# Patient Record
Sex: Female | Born: 1965 | Race: Black or African American | Hispanic: No | Marital: Single | Smoking: Never smoker
Health system: Southern US, Community
[De-identification: ages and names within clinical notes are randomized; demographics above are authoritative.]

## PROBLEM LIST (undated history)

## (undated) DIAGNOSIS — M1990 Unspecified osteoarthritis, unspecified site: Secondary | ICD-10-CM

## (undated) HISTORY — DX: Unspecified osteoarthritis, unspecified site: M19.90

## (undated) HISTORY — PX: KNEE SURGERY: SHX244

---

## 2009-12-14 ENCOUNTER — Ambulatory Visit
Admit: 2009-12-14 | Disposition: A | Discharge status: Home / Self Care | Payer: Self-pay | Source: Ambulatory Visit | Admitting: Dermatology

## 2009-12-14 LAB — COMPREHENSIVE METABOLIC PANEL
ALT: 16 U/L (ref 0–55)
AST (SGOT): 24 U/L (ref 5–34)
Albumin/Globulin Ratio: 1 (ref 0.9–2.2)
Albumin: 3.3 g/dL — ABNORMAL LOW (ref 3.5–5.0)
Alkaline Phosphatase: 39 U/L — ABNORMAL LOW (ref 40–150)
BUN: 7 mg/dL (ref 6.0–20.0)
Bilirubin, Total: 0.2 mg/dL (ref 0.1–1.2)
CO2: 26 mEq/L (ref 21–30)
Calcium: 9.4 mg/dL (ref 8.5–10.5)
Chloride: 106 mEq/L (ref 96–109)
Creatinine: 0.8 mg/dL (ref 0.4–1.5)
Globulin: 3.4 g/dL (ref 2.0–3.7)
Glucose: 85 mg/dL (ref 70–100)
Potassium: 3.9 mEq/L (ref 3.5–5.3)
Protein, Total: 6.7 g/dL (ref 6.0–8.3)
Sodium: 140 mEq/L (ref 135–146)

## 2009-12-14 LAB — CBC AND DIFFERENTIAL
Baso(Absolute): 0.02 10*3/uL (ref 0.00–0.20)
Basophils: 0 % (ref 0–2)
Eosinophils Absolute: 0.07 10*3/uL (ref 0.00–0.70)
Eosinophils: 1 % (ref 0–5)
Hematocrit: 37.7 % (ref 37.0–47.0)
Hgb: 12.6 g/dL (ref 12.0–16.0)
Immature Granulocytes Absolute: 0.01 10*3/uL
Immature Granulocytes: 0 % (ref 0–1)
Lymphocytes Absolute: 1.81 10*3/uL (ref 0.50–4.40)
Lymphocytes: 30 % (ref 15–41)
MCH: 29 pg (ref 28.0–32.0)
MCHC: 33.4 g/dL (ref 32.0–36.0)
MCV: 86.7 fL (ref 80.0–100.0)
MPV: 11.3 fL (ref 9.4–12.3)
Monocytes Absolute: 0.4 10*3/uL (ref 0.00–1.20)
Monocytes: 7 % (ref 0–11)
Neutrophils Absolute: 3.66 10*3/uL
Neutrophils: 61 % (ref 52–75)
Platelets: 276 10*3/uL (ref 140–400)
RBC: 4.35 10*6/uL (ref 4.20–5.40)
RDW: 14 % (ref 12–15)
WBC: 5.97 10*3/uL (ref 3.50–10.80)

## 2009-12-14 LAB — IRON PROFILE
Iron Saturation: 13 % — ABNORMAL LOW (ref 15–50)
Iron: 56 ug/dL (ref 40–145)
TIBC: 443 ug/dL (ref 265–497)
UIBC: 387 ug/dL — ABNORMAL HIGH (ref 126–382)

## 2009-12-14 LAB — TSH: TSH: 0.95 u[IU]/mL (ref 0.35–4.94)

## 2009-12-14 LAB — HEMOLYSIS INDEX: Hemolysis Index: 2 Index (ref 0–18)

## 2009-12-14 LAB — T4, FREE: T4 Free: 1.18 ng/dL (ref 0.70–1.48)

## 2009-12-14 LAB — T3, FREE: T3, Free: 2.65 pg/mL (ref 1.71–3.71)

## 2009-12-14 LAB — GFR: EGFR: >60

## 2009-12-14 LAB — FERRITIN: Ferritin: 46.77 ng/mL (ref 4.60–204.00)

## 2009-12-15 LAB — DEHYDROEPIANDROSTERONE SULFATE, SERUM-SOFT: Dehydroepiandrosterone Sulfate, Serum: 45.1 ug/dL (ref 18–244)

## 2009-12-18 LAB — SJOGRENS SYNDROME-B EXTRACTABLE NUCLEAR ANTIBODY
Sjogrens SSB (La) Antibody Interpretation: NEGATIVE
Sjogrens SSB (La) Antibody: 4 U/mL (ref 0–99)

## 2009-12-18 LAB — TESTOSTERONE, FREE, TOTAL - SOFT
TESTOSTERONE, FREE, S: <0.2 ng/dL — ABNORMAL LOW (ref 0.3–1.9)
Testosterone, Total: 22 ng/dL (ref 8–60)

## 2009-12-18 LAB — ANDROSTENEDIONE-SOFT: Androstenedione: 43 ng/dL (ref 30–200)

## 2009-12-18 LAB — SJOGRENS SYNDROME-A EXTRACTABLE NUCLEAR ANTIBODY(SOFT)
SSA Interpretation: NEGATIVE
Sjogrens SSA (Ro) Antibody: 3 U/mL (ref 0–99)

## 2009-12-19 LAB — MISCELLANEOUS QUEST TEST

## 2014-07-27 ENCOUNTER — Encounter
Admission: RE | Disposition: A | Discharge status: Home / Self Care | Payer: Self-pay | Source: Ambulatory Visit | Attending: Gastroenterology

## 2014-07-27 ENCOUNTER — Ambulatory Visit
Admission: RE | Admit: 2014-07-27 | Discharge: 2014-07-27 | Disposition: A | Discharge status: Home / Self Care | Source: Ambulatory Visit | Attending: Gastroenterology | Admitting: Gastroenterology

## 2014-07-27 ENCOUNTER — Ambulatory Visit: Admitting: Certified Registered"

## 2014-07-27 ENCOUNTER — Ambulatory Visit: Admitting: Gastroenterology

## 2014-07-27 ENCOUNTER — Ambulatory Visit: Payer: Self-pay

## 2014-07-27 DIAGNOSIS — D128 Benign neoplasm of rectum: Secondary | ICD-10-CM

## 2014-07-27 DIAGNOSIS — K648 Other hemorrhoids: Secondary | ICD-10-CM | POA: Insufficient documentation

## 2014-07-27 DIAGNOSIS — Z1211 Encounter for screening for malignant neoplasm of colon: Secondary | ICD-10-CM | POA: Insufficient documentation

## 2014-07-27 DIAGNOSIS — K621 Rectal polyp: Secondary | ICD-10-CM | POA: Insufficient documentation

## 2014-07-27 HISTORY — PX: COLONOSCOPY, BIOPSY: SHX3468

## 2014-07-27 SURGERY — COLONOSCOPY, WITH BIOPSY
Anesthesia: Anesthesia General | Site: Abdomen | Wound class: Clean Contaminated

## 2014-07-27 MED ORDER — PROPOFOL INFUSION 10 MG/ML
INTRAVENOUS | Status: DC | PRN
Start: 2014-07-27 — End: 2014-07-27
  Administered 2014-07-27 (×5): 20 mg via INTRAVENOUS

## 2014-07-27 MED ORDER — PROPOFOL INFUSION 10 MG/ML
INTRAVENOUS | Status: DC | PRN
Start: 2014-07-27 — End: 2014-07-27
  Administered 2014-07-27: 100 ug/kg/min via INTRAVENOUS

## 2014-07-27 MED ORDER — LIDOCAINE HCL 2 % IJ SOLN
INTRAMUSCULAR | Status: DC | PRN
Start: 2014-07-27 — End: 2014-07-27
  Administered 2014-07-27: 40 mg via INTRAVENOUS

## 2014-07-27 MED ORDER — LACTATED RINGERS IV SOLN
INTRAVENOUS | Status: DC | PRN
Start: 2014-07-27 — End: 2014-07-27

## 2014-07-27 SURGICAL SUPPLY — 19 items
CANISTER 1000CC (Procedure Accessories) ×2 IMPLANT
FORCEPS BIOPSY L240 CM MICROMESH TEETH STREAMLINE CATHETER NEEDLE (Instrument) IMPLANT
FORCEPS BIOPSY L240 CM STANDARD CAPACITY (Instrument) ×1
FORCEPS RADIAL JAW 4 2.8MM (Instrument) ×1
GAUZE SPONGE 4X4 NS (Dressing) ×10
GOWN ISO YELLOW UNIVERSAL (Gown) ×4 IMPLANT
KIT UNIVERSAL IRRIGATION SOL (Kits) ×2 IMPLANT
SOFT-CUF 2T ADULT SUB-MIN (Cuff) ×2 IMPLANT
SOL WATER STERILE 1000CC BTLE (Irrigation Solutions) ×1
SOLN LUBRICATING JELLY 4.25OZ (Irrigation Solutions) ×2 IMPLANT
SPONGE GAUZE L4 IN X W4 IN 16 PLY (Dressing) ×10
SPONGE GAUZE L4 IN X W4 IN 16 PLY MAXIMUM ABSORBENT USP TYPE VII (Dressing) ×10 IMPLANT
SYRINGE 50 ML GRADUATE NONPYROGENIC DEHP (Syringes, Needles) ×1
SYRINGE 50 ML GRADUATE NONPYROGENIC DEHP FREE PVC FREE BD MEDICAL (Syringes, Needles) ×1 IMPLANT
SYRINGE SLIP-TIP 60CC (Syringes, Needles) ×1
WATER STERILE PLASTIC POUR BOTTLE 1000 (Irrigation Solutions) ×1
WATER STERILE PLASTIC POUR BOTTLE 1000 ML (Irrigation Solutions) ×1 IMPLANT
WATER STERILE PLASTIC POUR BOTTLE 250 ML (Irrigation Solutions) ×1 IMPLANT
WATER STRL IRRIG 250ML BTL (Irrigation Solutions) ×1

## 2014-07-27 NOTE — Discharge Instructions (Signed)
Endoscopy Discharge Instructions  General Instructions:  1. Following sedation, your judgement, perception, and coordination are considered impaired. Even though you may feel awake and alert, you are considered legally intoxicated. Therefore, until the next morning;   Do not Drive   Do not operate appliances or equipment that requires reaction time (e.g.    Stove, electrical tools, machinery)   Do not sign legal documents or be involved in important decisions.   Do not smoke if alone   Do not drink alcoholic beverages   Go directly home and rest for several hours before resuming your routine    activities.   It is highly recommended to have a responsible adult stay with you for the   next 24 hours    2. Tenderness, swelling or pain may occur at the IV site where you received sedation. If you experience this, apply warm soaks to the area. Notify your physician if this persists.    Instructions Specific To Procedures - Report To Physician Any Of The Following:    Upper Endoscopy, ERCP, Dilations   1. Pain in Chest   2. Nausea/vomitting   3. Fevers/Chills within 24 hours after procedure. Temp>101deg F   4. Severe and persistent abdominal pain and bloating     In Addition:   Mild throat soreness may follow this procedure. Warm salt water gargling or   lozenges of your choice will most likely relieve your discomfort or cold drinks and   popsicles.     Colon/Sigmoidoscopy/Proctoscopy   1. Severe and persistent abdominal pain/bloating which does not subside within 2-3 hours   2. Large amount of rectal bleeding (some mucosal blood streaking may occur, especially if biopsy or polypectomy was done or if hemorrhoids are present.   3. Nausea/vomitting   4. Fevers/Chills within 24 hours after procedure. Temp>101deg F     In Addition:\     If polyp has been removed, DO NOT take aspirin or aspirin containing products   (e.g. Anacin, Alka Seltzer, Bufferin, Etc.) or non-steroidal anti-inflammatory drugs  (e.g. Advil, Motrin,  etc.) for 14 days unless otherwise advised otherwise on your Patient Procedure Report and Recommendations. Tylenol or extra Strength Tylenol is permitted.        Additional Discharge Instructions  Your diet after the procedure: No restriction.  Special Instructions: See Procedure Patient Report Recommendations    If biopsies were taken, call Dr. Everardo Voris at [phone] in one week for results    Patient education literature given; Yes      If you have questions or problems contact your MD immediately. If you need immediate attention, call your MD, 911 and/or go to nearest emergency room.

## 2014-07-27 NOTE — Transfer of Care (Signed)
Anesthesia Transfer of Care Note    Patient: Jodi Klein    Procedures performed: Procedure(s):  COLONOSCOPY, BIOPSY    Anesthesia type: General TIVA    Patient location:[facility]    Last vitals:   Filed Vitals:    07/27/14 1018   BP: 100/58   Pulse: 73   Temp: 36.2 C (97.2 F)   Resp: 14   SpO2: 97%       Post pain: Patient not complaining of pain, continue current therapy      Mental Status:awake and alert     Respiratory Function: tolerating room air    Cardiovascular: stable    Nausea/Vomiting: patient not complaining of nausea or vomiting    Hydration Status: adequate    Post assessment: no apparent anesthetic complications, no reportable events and no evidence of recall

## 2014-07-27 NOTE — Anesthesia Preprocedure Evaluation (Signed)
Anesthesia Evaluation    AIRWAY    Mallampati: II    TM distance: >3 FB  Neck ROM: full  Mouth Opening:full   CARDIOVASCULAR    cardiovascular exam normal       DENTAL    no notable dental hx     PULMONARY    pulmonary exam normal     OTHER FINDINGS                      Anesthesia Plan    ASA 2     general                     intravenous induction   Detailed anesthesia plan: general IV        Post op pain management: per surgeon    informed consent obtained    Plan discussed with CRNA.

## 2014-07-27 NOTE — H&P (Signed)
GI PRE PROCEDURE NOTE    Proceduralist Comments:   Review of Systems and Past Medical / Surgical History performed: Yes     Indications:Colon cancer screening        Physical Exam / Laboratory Data (If applicable)       General: Alert and cooperative  Lungs: Lungs clear to auscultation  Cardiac: RRR, normal S1S2.    Abdomen: Soft, non tender. Normal active bowel sounds  Other:         Planned Sedation:   Deep sedation with anesthesia    Attestation:   Jodi Klein has been reassessed immediately prior to the procedure and is an appropriate candidate for the planned sedation and procedure. Risks, benefits and alternatives to the planned procedure and sedation have been explained to the patient or guardian:  yes        Signed by: Virgina Norfolk

## 2014-07-27 NOTE — Anesthesia Postprocedure Evaluation (Signed)
Anesthesia Post Evaluation    Patient: Torri Knupp    Procedures performed: Procedure(s):  COLONOSCOPY, BIOPSY    Anesthesia type: General TIVA    Patient location:[facility]    Last vitals:   Filed Vitals:    07/27/14 1055   BP: 104/59   Pulse: 66   Temp: 36.2   Resp: 16   SpO2: 99%       Post pain: Patient not complaining of pain, continue current therapy      Mental Status:awake and alert     Respiratory Function: tolerating room air    Cardiovascular: stable    Nausea/Vomiting: patient not complaining of nausea or vomiting    Hydration Status: adequate    Post assessment: no apparent anesthetic complications

## 2014-07-28 LAB — LAB USE ONLY - HISTORICAL SURGICAL PATHOLOGY

## 2014-07-31 ENCOUNTER — Encounter: Payer: Self-pay | Admitting: Gastroenterology

## 2020-01-10 IMAGING — MG DIGITAL SCREENING BILATERAL MAMMOGRAM WITH CAD
4 series · 4 of 4 positions shown · non-contrast
Comparison: Previous exam(s).

CLINICAL DATA: Screening.

EXAM:
DIGITAL SCREENING BILATERAL MAMMOGRAM WITH CAD

[L MLO]
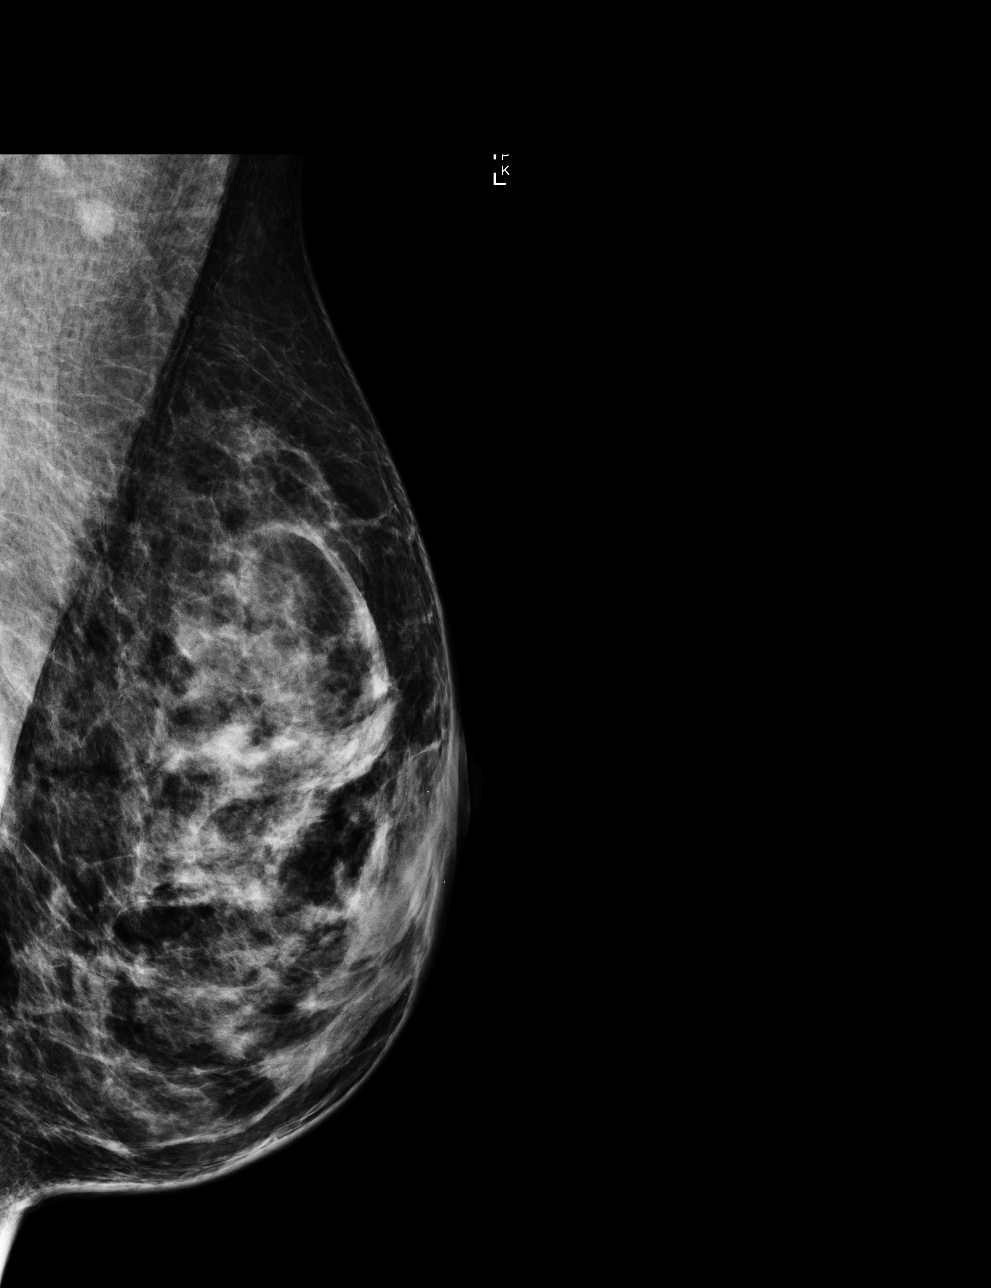

[R CC]
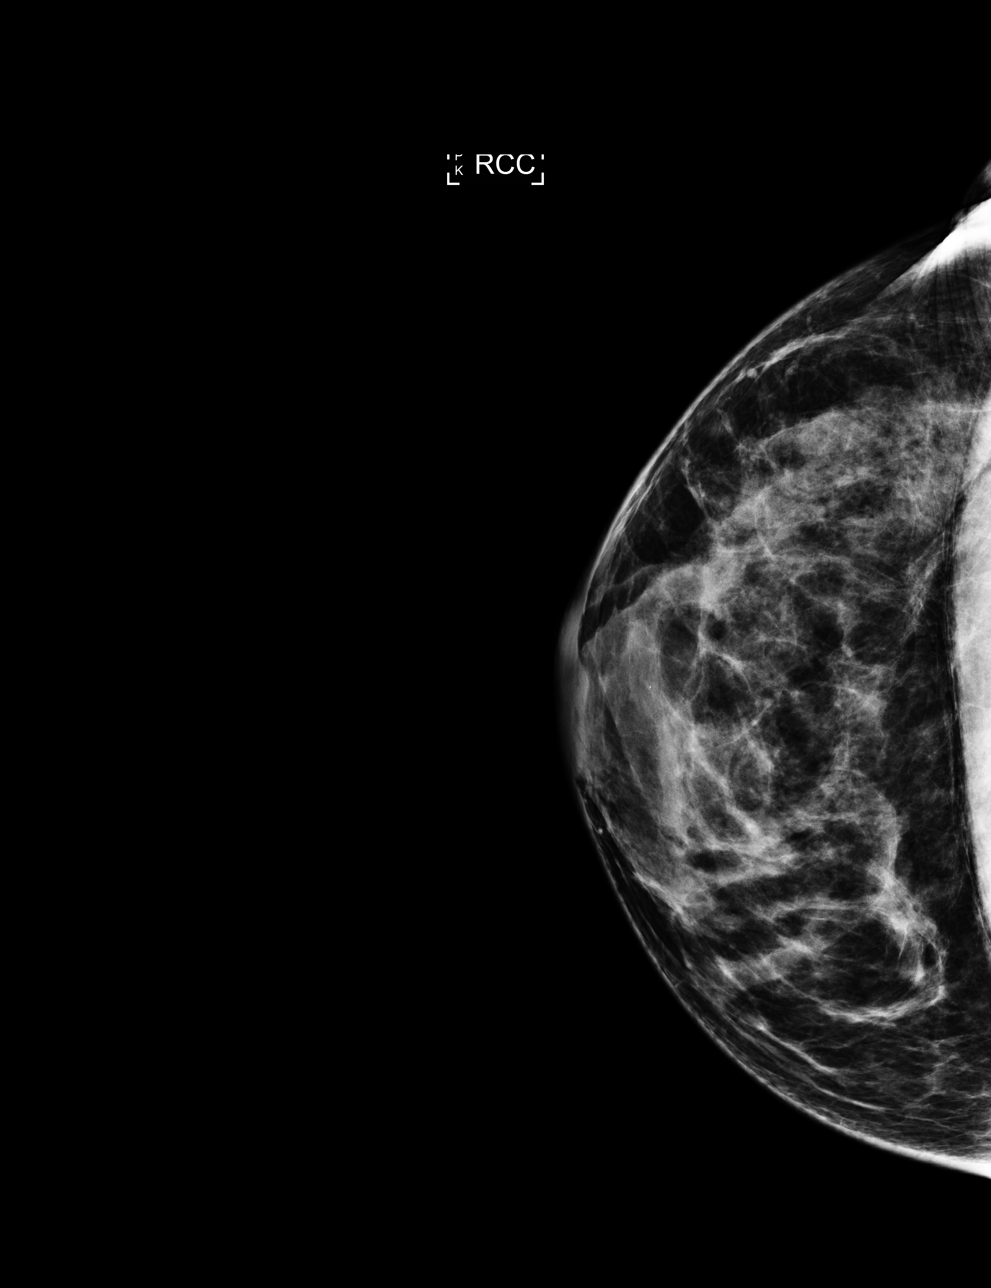

[L CC]
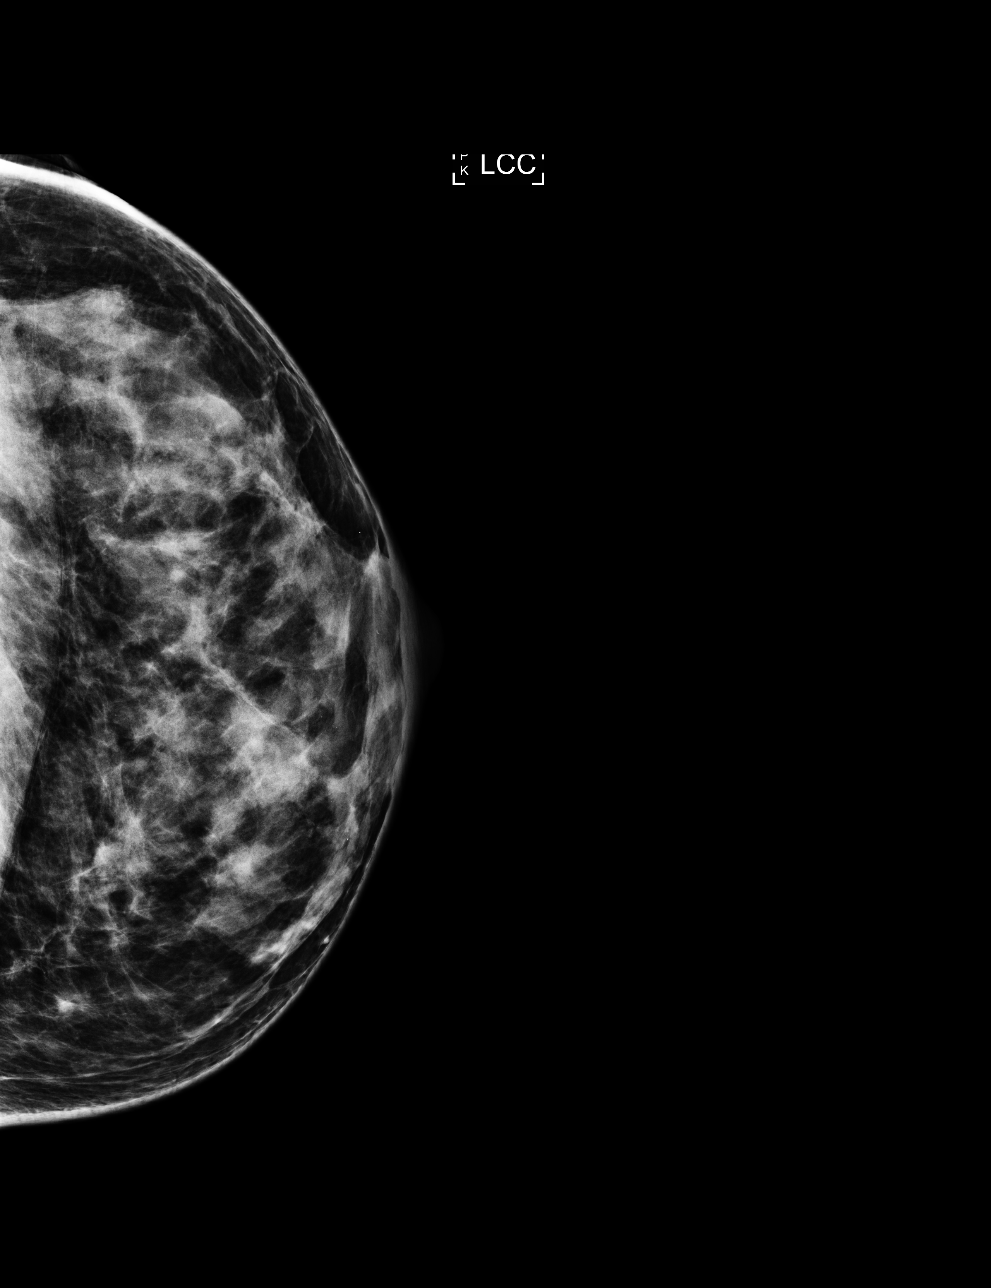

[R MLO]
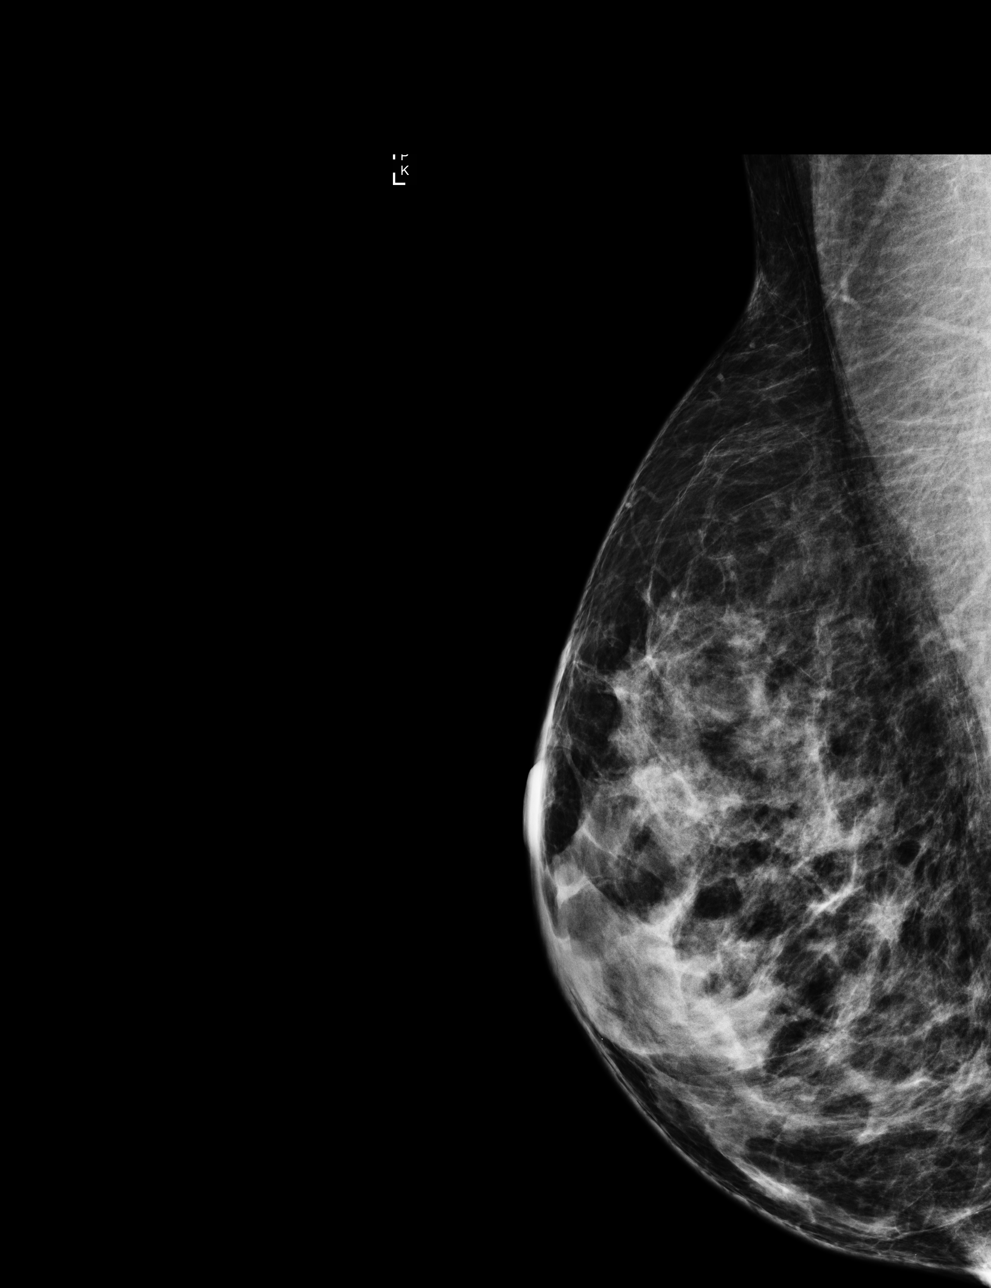

[4 of 4 positions shown; findings below may reference images not displayed]

ACR Breast Density Category c: The breast tissue is heterogeneously
dense, which may obscure small masses.
FINDINGS: There are no findings suspicious for malignancy. Images were
processed with CAD.
IMPRESSION: No mammographic evidence of malignancy. A result letter of this
screening mammogram will be mailed directly to the patient.

RECOMMENDATION:
Screening mammogram in one year. (Code:YJ-[id number])

BI-RADS CATEGORY  1: Negative.

## 2024-03-05 IMAGING — MG MM DIGITAL SCREENING BILAT W/ TOMO AND CAD
8 series · 9 of 24 positions shown · non-contrast
Comparison: Previous exam(s).

CLINICAL DATA: Screening.

EXAM:
DIGITAL SCREENING BILATERAL MAMMOGRAM WITH TOMOSYNTHESIS AND CAD
TECHNIQUE: Bilateral screening digital craniocaudal and mediolateral oblique
mammograms were obtained. Bilateral screening digital breast
tomosynthesis was performed. The images were evaluated with
computer-aided detection.

[R MLO synth-2D]
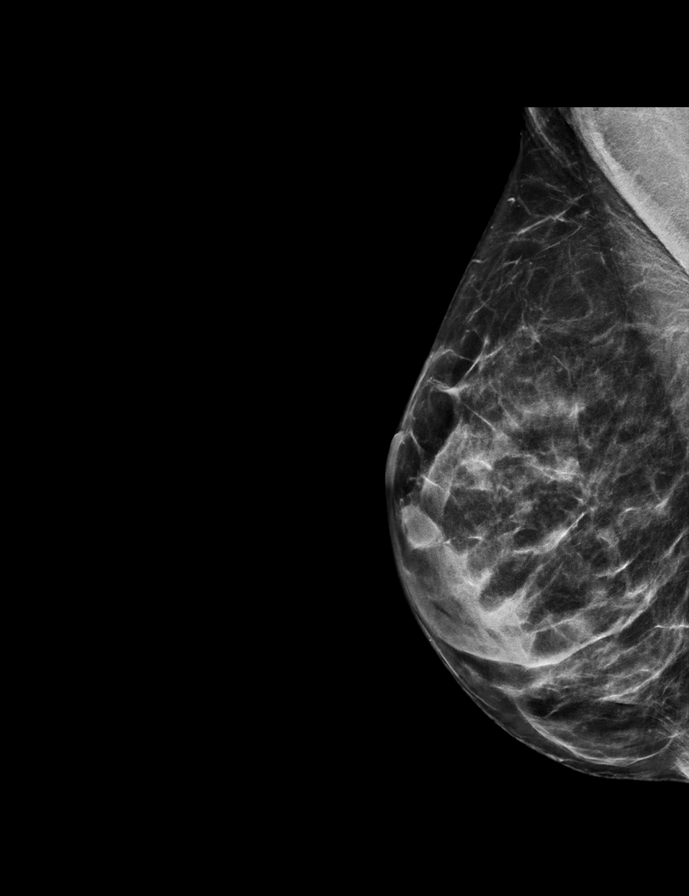

[L CC synth-2D]
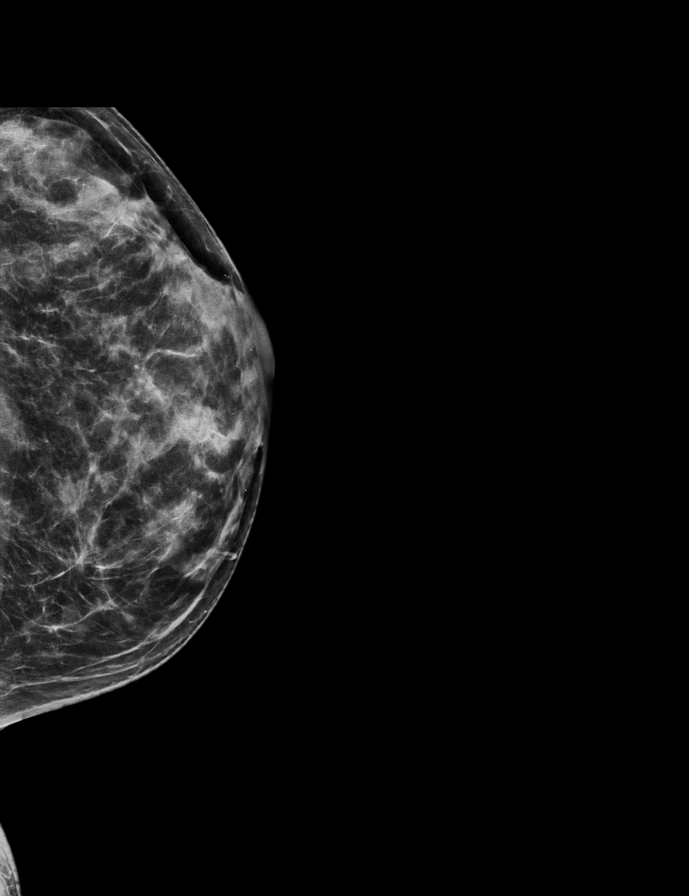

[L MLO synth-2D]
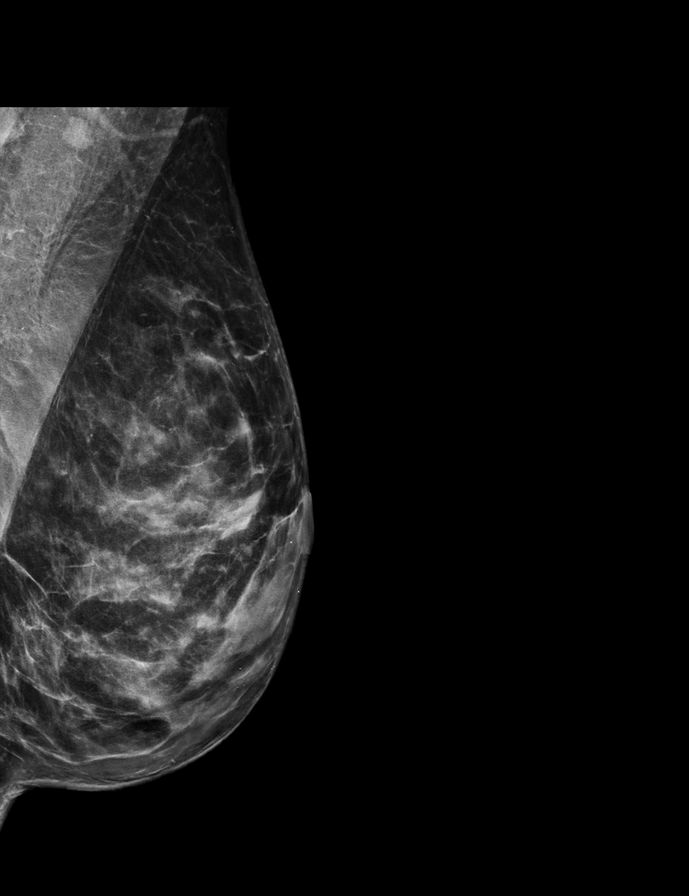

[R CC synth-2D]
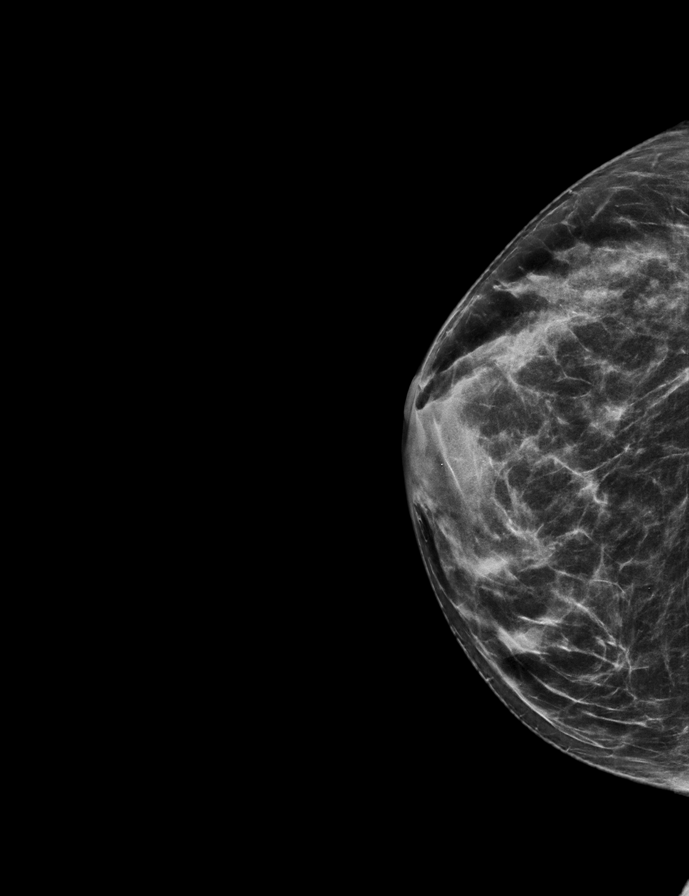

[L MLO tomo · 2 of 68 frames shown]
[frame 22/68]
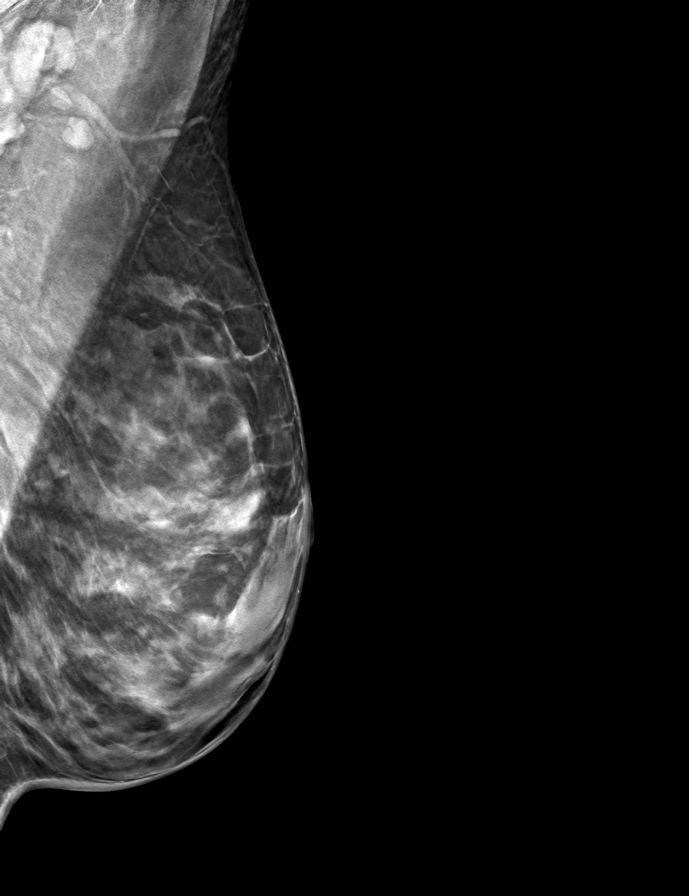
[frame 35/68]
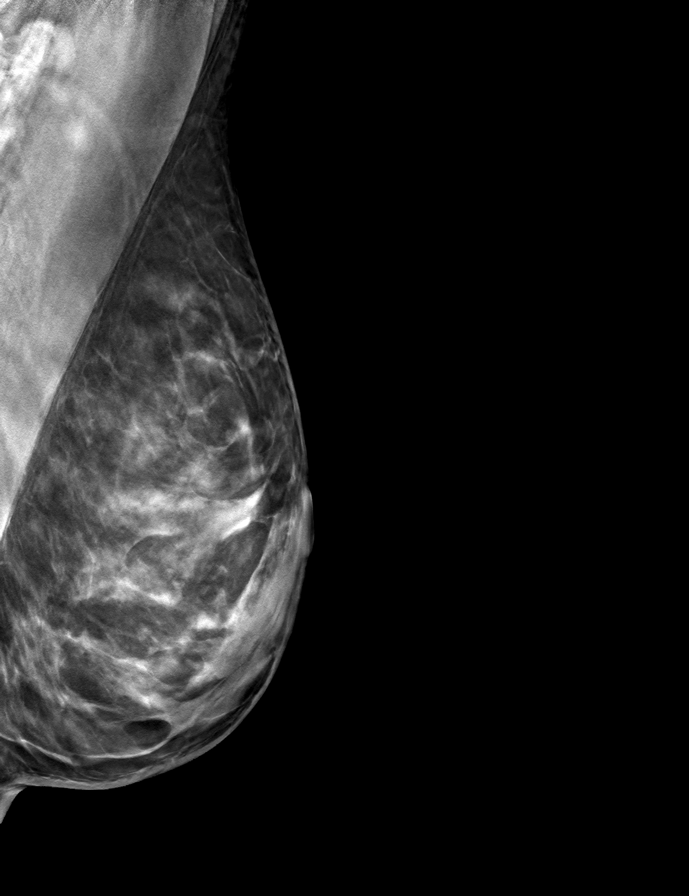

[R CC tomo · tomo slice 31/60.0]
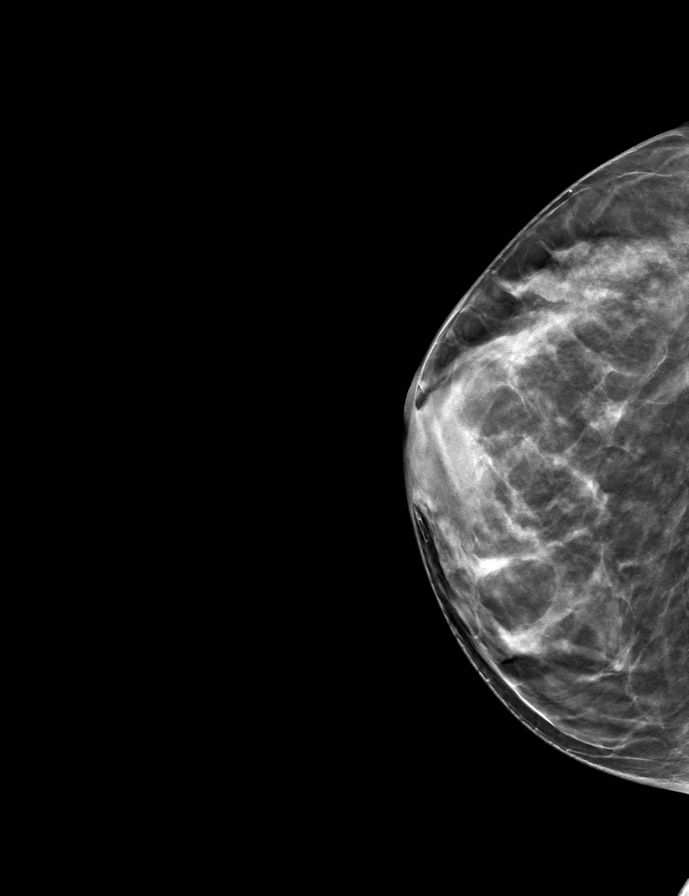

[R MLO tomo · tomo slice 33/64.0]
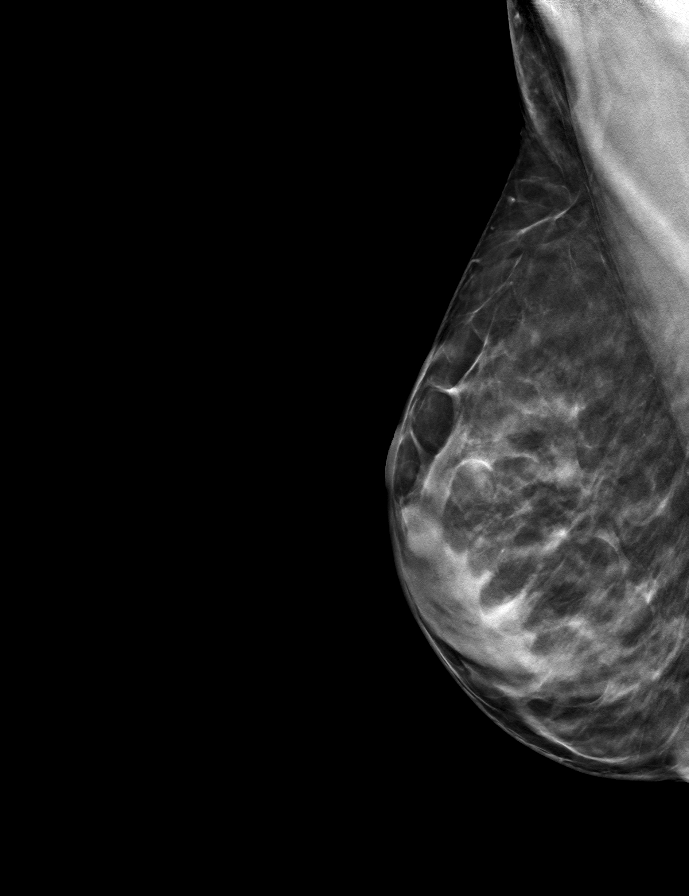

[L CC tomo · tomo slice 29/56.0]
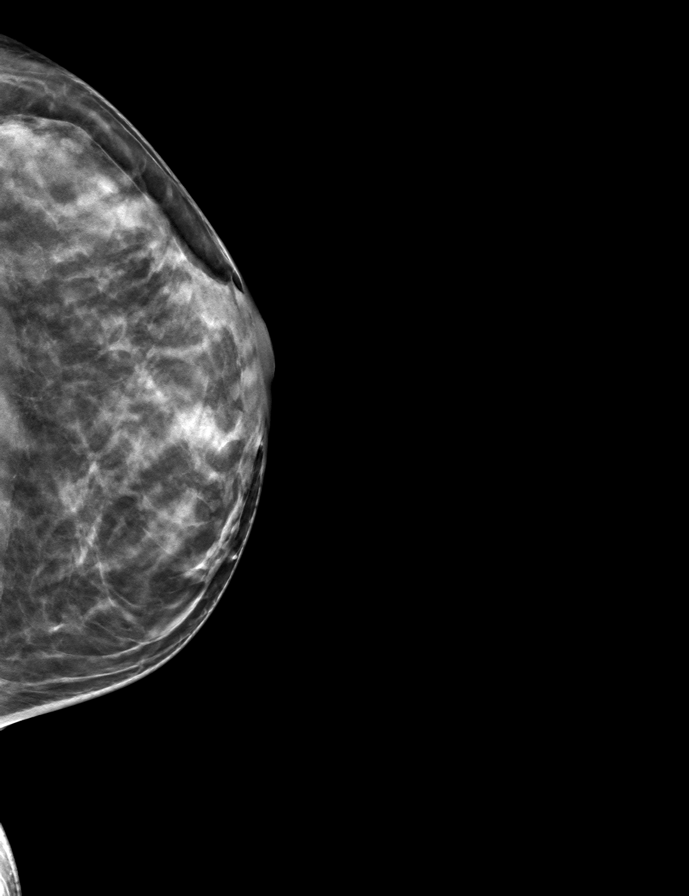

[9 of 24 positions shown; findings below may reference images not displayed]

ACR Breast Density Category c: The breast tissue is heterogeneously
dense, which may obscure small masses.
FINDINGS: There are no findings suspicious for malignancy.
IMPRESSION: No mammographic evidence of malignancy. A result letter of this
screening mammogram will be mailed directly to the patient.

RECOMMENDATION:
Screening mammogram in one year. (Code:Q3-W-BC3)

BI-RADS CATEGORY  1: Negative.
